# Patient Record
Sex: Male | Born: 1990 | Race: White | Hispanic: Yes | Marital: Single | Smoking: Current every day smoker
Health system: Southern US, Community
[De-identification: ages and names within clinical notes are randomized; demographics above are authoritative.]

## PROBLEM LIST (undated history)

## (undated) HISTORY — PX: FINGER SURGERY: SHX640

---

## 2013-09-07 ENCOUNTER — Emergency Department: Payer: Self-pay | Admitting: Emergency Medicine

## 2014-02-03 ENCOUNTER — Emergency Department: Payer: Self-pay | Admitting: Emergency Medicine

## 2014-02-03 LAB — CBC WITH DIFFERENTIAL/PLATELET
Basophil #: 0 10*3/uL (ref 0.0–0.1)
Basophil %: 0.2 %
EOS PCT: 0.4 %
Eosinophil #: 0 10*3/uL (ref 0.0–0.7)
HCT: 47.4 % (ref 40.0–52.0)
HGB: 15.7 g/dL (ref 13.0–18.0)
LYMPHS PCT: 8.9 %
Lymphocyte #: 0.9 10*3/uL — ABNORMAL LOW (ref 1.0–3.6)
MCH: 28.5 pg (ref 26.0–34.0)
MCHC: 33.2 g/dL (ref 32.0–36.0)
MCV: 86 fL (ref 80–100)
MONOS PCT: 8.8 %
Monocyte #: 0.9 x10 3/mm (ref 0.2–1.0)
NEUTROS ABS: 8.5 10*3/uL — AB (ref 1.4–6.5)
NEUTROS PCT: 81.7 %
PLATELETS: 194 10*3/uL (ref 150–440)
RBC: 5.52 10*6/uL (ref 4.40–5.90)
RDW: 14.3 % (ref 11.5–14.5)
WBC: 10.4 10*3/uL (ref 3.8–10.6)

## 2014-02-03 LAB — COMPREHENSIVE METABOLIC PANEL
ALT: 39 U/L (ref 12–78)
ANION GAP: 4 — AB (ref 7–16)
AST: 36 U/L (ref 15–37)
Albumin: 3.8 g/dL (ref 3.4–5.0)
Alkaline Phosphatase: 127 U/L — ABNORMAL HIGH
BILIRUBIN TOTAL: 0.6 mg/dL (ref 0.2–1.0)
BUN: 11 mg/dL (ref 7–18)
CALCIUM: 8.7 mg/dL (ref 8.5–10.1)
CREATININE: 0.83 mg/dL (ref 0.60–1.30)
Chloride: 105 mmol/L (ref 98–107)
Co2: 27 mmol/L (ref 21–32)
EGFR (African American): >60
EGFR (Non-African Amer.): >60
GLUCOSE: 97 mg/dL (ref 65–99)
Osmolality: 271 (ref 275–301)
POTASSIUM: 4 mmol/L (ref 3.5–5.1)
Sodium: 136 mmol/L (ref 136–145)
Total Protein: 7.5 g/dL (ref 6.4–8.2)

## 2014-04-26 ENCOUNTER — Emergency Department: Payer: Self-pay | Admitting: Emergency Medicine

## 2015-01-04 ENCOUNTER — Emergency Department: Payer: Self-pay | Admitting: Internal Medicine

## 2015-04-09 ENCOUNTER — Emergency Department
Admission: EM | Admit: 2015-04-09 | Discharge: 2015-04-09 | Disposition: A | Discharge status: Home / Self Care | Payer: Medicaid Other | Attending: Emergency Medicine | Admitting: Emergency Medicine

## 2015-04-09 ENCOUNTER — Encounter: Payer: Self-pay | Admitting: Emergency Medicine

## 2015-04-09 ENCOUNTER — Emergency Department: Payer: Medicaid Other

## 2015-04-09 DIAGNOSIS — S60222A Contusion of left hand, initial encounter: Secondary | ICD-10-CM

## 2015-04-09 DIAGNOSIS — Y9289 Other specified places as the place of occurrence of the external cause: Secondary | ICD-10-CM | POA: Diagnosis not present

## 2015-04-09 DIAGNOSIS — Y998 Other external cause status: Secondary | ICD-10-CM | POA: Insufficient documentation

## 2015-04-09 DIAGNOSIS — Z720 Tobacco use: Secondary | ICD-10-CM | POA: Insufficient documentation

## 2015-04-09 DIAGNOSIS — Y9389 Activity, other specified: Secondary | ICD-10-CM | POA: Diagnosis not present

## 2015-04-09 DIAGNOSIS — W1839XA Other fall on same level, initial encounter: Secondary | ICD-10-CM | POA: Diagnosis not present

## 2015-04-09 DIAGNOSIS — S6992XA Unspecified injury of left wrist, hand and finger(s), initial encounter: Secondary | ICD-10-CM | POA: Diagnosis present

## 2015-04-09 MED ORDER — IBUPROFEN 800 MG PO TABS: 800.0000 mg | ORAL_TABLET | Freq: Once | ORAL | Status: DC

## 2015-04-09 MED ORDER — TRAMADOL HCL 50 MG PO TABS: 50.0000 mg | ORAL_TABLET | Freq: Four times a day (QID) | ORAL | Status: AC | PRN

## 2015-04-09 MED ORDER — IBUPROFEN 800 MG PO TABS: 800.0000 mg | ORAL_TABLET | Freq: Three times a day (TID) | ORAL | Status: DC | PRN

## 2015-04-09 MED ORDER — IBUPROFEN 800 MG PO TABS
ORAL_TABLET | ORAL | Status: AC
  Filled 2015-04-09: qty 1

## 2015-04-09 NOTE — ED Notes (Signed)
Pt taken to Xray.

## 2015-04-09 NOTE — ED Notes (Signed)
Hurts with movement

## 2015-04-09 NOTE — ED Provider Notes (Signed)
[facility] Emergency Department Provider Note  ____________________________________________  Time seen: Approximately 1800  I have reviewed the triage vital signs and the nursing notes.   HISTORY  Chief Complaint Finger Injury    HPI Jackson Li is a 24 y.o. male L done yesterday landing on his left hand is concerned may have broken something due to the amount of swelling rates his pain as about 8 out of 10 and worse with movement relieved with rest denies any numbness tingling or weakness in the extremity has no other associated signs or symptoms as of note   History reviewed. No pertinent past medical history.  There are no active problems to display for this patient.   No past surgical history on file.  Current Outpatient Rx  Name  Route  Sig  Dispense  Refill  . ibuprofen (ADVIL,MOTRIN) 800 MG tablet   Oral   Take 1 tablet (800 mg total) by mouth every 8 (eight) hours as needed.   30 tablet   0   . traMADol (ULTRAM) 50 MG tablet   Oral   Take 1 tablet (50 mg total) by mouth every 6 (six) hours as needed for moderate pain or severe pain.   8 tablet   0     Allergies Review of patient's allergies indicates no known allergies.  No family history on file.  Social History History  Substance Use Topics  . Smoking status: Current Every Day Smoker -- 0.30 packs/day  . Smokeless tobacco: Not on file  . Alcohol Use: No    Review of Systems Constitutional: No fever/chills Eyes: No visual changes. ENT: No sore throat. Cardiovascular: Denies chest pain. Respiratory: Denies shortness of breath. Gastrointestinal: No abdominal pain.  No nausea, no vomiting.  No diarrhea.  No constipation. Genitourinary: Negative for dysuria. Musculoskeletal: Negative for back pain. Skin: Negative for rash. Neurological: Negative for headaches, focal weakness or numbness.  6-point ROS otherwise  negative.  ____________________________________________   PHYSICAL EXAM:  VITAL SIGNS: ED Triage Vitals  Enc Vitals Group     BP 04/09/15 1714 139/82 mmHg     Pulse Rate 04/09/15 1714 91     Resp 04/09/15 1714 18     Temp 04/09/15 1714 97.4 F (36.3 C)     Temp Source 04/09/15 1714 Oral     SpO2 04/09/15 1714 100 %     Weight 04/09/15 1714 220 lb (99.791 kg)     Height 04/09/15 1714 5\' 8"  (1.727 m)     Head Cir --      Peak Flow --      Pain Score 04/09/15 1715 7     Pain Loc --      Pain Edu? --      Excl. in GC? --     Constitutional: Alert and oriented. Well appearing and in no acute distress. Eyes: Conjunctivae are normal. PERRL. EOMI. Head: Atraumatic. Nose: No congestion/rhinnorhea. Mouth/Throat: Mucous membranes are moist.  Oropharynx non-erythematous. Cardiovascular: Normal rate, regular rhythm. Grossly normal heart sounds.  Good peripheral circulation. Respiratory: Normal respiratory effort.  No retractions. Lungs CTAB. Musculoskeletal: No lower extremity tenderness nor edema.  Swelling along the fourth and fifth metacarpals of the left hand there is no functional deficit 55 strength equal symmetrical bilaterally no palpable deformity or visible abnormality Neurologic:  Normal speech and language. No gross focal neurologic deficits are appreciated. Speech is normal. No gait instability. Skin:  Skin is warm, dry and intact. No rash noted. Bruising noted  to the left hand Psychiatric: Mood and affect are normal. Speech and behavior are normal.  ____________________________________________    RADIOLOGY  X-rays of patient's left hand was negative ____________________________________________   PROCEDURES  Procedure(s) performed: None  Critical Care performed: No  ____________________________________________   INITIAL IMPRESSION / ASSESSMENT AND PLAN / ED COURSE  Pertinent labs & imaging results that were available during my care of the patient were  reviewed by me and considered in my medical decision making (see chart for details).  Initial impression left hand contusion patient was placed in Ace wrap will be recommended ice and elevation through the night take Motrin as needed for pain follow-up with orthopedics for any further concerns return here for any acute concerns or worsening symptoms ____________________________________________   FINAL CLINICAL IMPRESSION(S) / ED DIAGNOSES  Final diagnoses:  Hand contusion, left, initial encounter      Micael Barb Rosalyn GessWilliam C Yecheskel Kurek, PA-C 04/09/15 1842  Sharyn CreamerMark Quale, MD 04/14/15 1659

## 2015-04-09 NOTE — Discharge Instructions (Signed)
Traumatismo contuso (Blunt Trauma) Usted ha sido evaluado por sus lesiones. Fue examinado y Mining engineerel profesional que lo asiste no Clinical cytogeneticistencontr lesiones lo suficientemente graves como para que requiera hospitalizacin. Luego de un accidente, es comn presentar mltiples magullones y dolores musculares. Estos tienden a Product manageraumentar durante las primeras 24 horas, con ms rigidez y TEFL teacherdolor en las horas siguientes, que empeorarn cuando se levante de la cama la primera maana luego del accidente. A partir de all, debera comenzar a Risk managermejorar cada da que pase. El nivel de mejora generalmente depende de la importancia de las lesiones sufridas en el accidente. Luego del accidente, si alguna parte de su cuerpo no responde como debera, o si el dolor en alguna zona se incrementa, debe regresar al ColgateServicio de Emergencias para que lo examinen nuevamente.  INSTRUCCIONES PARA EL CUIDADO DOMICILIARIO Los cuidados de rutina para las zonas doloridas deben incluir:  Harrah's EntertainmentHielo en las zonas doloridas cada 2 horas durante 20 minutos mientras est despierto durante los prximos 2 das.  Beber lquidos en abundancia (excluya el alcohol).  Darse una ducha caliente o tibia o tomar un bao una o dos veces por da para aumentar el flujo sanguneo en los msculos doloridos. Esto lo ayudar a IT sales professionalrecuperar la agilidad.  Actividades segn las tolere. Levantar pesos Social research officer, governmentpuede agravar el dolor de cuello o espalda.  Utilice los medicamentos de venta libre o de prescripcin para Chief Technology Officerel dolor, Environmental health practitionerel malestar o la Pilot Moundfiebre, segn se lo indique el profesional que lo asiste. No tome aspirina. Podran aumentar los hematomas o las hemorragias en caso de que existan pequeas zonas en las que esto ocurre. SOLICITE ATENCIN MDICA DE INMEDIATO SI OBSERVA:  Entumecimiento, hormigueo, debilidad o problemas con el uso de los brazos o las piernas.  Dolor de cabeza intenso que no mejora con medicamentos.  Cambios en el control del intestino o la vejiga.  Aumento del dolor  en otras partes del cuerpo.  Dificultades respiratorias, mareos.  Nuseas, vmitos o sudoracin.  Aumento del malestar abdominal (en el vientre).  Sangre en la orina, en las heces o vmitos con Hobartsangre.  Dolor en los hombros en el rea donde se ubicaran los tirantes de Iron Beltuna prenda.  Sensacin de Golden West Financialmareos o si ha sufrido un episodio de Ingeniodesmayo. En algunos casos no es posible identificar todas las lesiones inmediatamente despus del traumatismo. Es importante que siga controlando su enfermedad despus de la visita al servicio de Sports administratoremergencias. Si siente que no mejora, o mejora ms lentamente que lo que debera esperarse, llame a su mdico. Si siente que sus sntomas (problemas) empeoran, vuelva al Servicio de Emergencias inmediatamente. Document Released: 11/17/2005 Document Revised: 02/09/2012 [facility] Patient Information 2015 [place]Care, [place]. This information is not intended to replace advice given to you by your health care provider. Make sure you discuss any questions you have with your health care provider.  Contusin  (Contusion)  Una contusin es un hematoma interno. Las contusiones ocurren cuando un traumatismo causa un sangrado debajo de la piel. Los signos de hematoma son dolor, inflamacin (hinchazn) y cambio de color en la piel. La contusin Clear Channel Communicationspuede volverse azul, prpura o Capronamarilla. CUIDADOS EN EL HOGAR   Aplique hielo sobre la zona lesionada.  Ponga el hielo en una bolsa plstica.  Colquese una toalla entre la piel y la bolsa de hielo.  Deje el hielo durante 15 a 20 minutos, 3 a 4 veces por da.  Slo tome los medicamentos segn le indique el mdico.  Haga que la zona lesionada repose.  En lo  posible, levante (eleve) la zona lesionada para disminuir la hinchazn. SOLICITE AYUDA DE INMEDIATO SI:   El hematoma o la hinchazn aumentan.  Siente que Community education officer.  La hinchazn o el dolor no se [employer] Energy. ASEGRESE DE QUE:   Comprende estas  instrucciones.  Controlar su enfermedad.  Solicitar ayuda de inmediato si no mejora o si empeora. Document Released: 11/06/2011 Document Revised: 02/09/2012 [facility] Patient Information 2015 [place], [place]. This information is not intended to replace advice given to you by your health care provider. Make sure you discuss any questions you have with your health care provider.  Crioterapia  (Cryotherapy)  El trmino crioterapia significa tratamiento mediante el fro. Bolsas con hielo o gel se utilizan para reducir Chief Technology Officer y la inflamacin. El hielo es ms efectivo dentro de las primeras 24 a 48 horas despus de una lesin o trastornos por uso excesivo de un msculo o Risk analyst. El hielo puede calmar esguinces, distensiones, espasmos, ardor, dolor punzante y [employer] Energy. Tambin puede usarse para la recuperacin luego de [place]. El hielo es Garrochales, tiene muy pocos efectos adversos y es seguro para que lo utilicen la mayora de Raytheon.  PRECAUCIONES  El hielo no es una opcin segura de tratamiento para las personas con:   Fenmeno de Raynaud. Este es un trastorno que afecta los vasos sanguneos pequeos en las extremidades. La exposicin al fro DTE Energy Company problemas vuelvan.  Hipersensibilidad al fro. Hay diferentes tipos de hipersensibilidad al fro, [employer] se incluyen:  Urticaria por el fro. Ronchas rojas y que pican que aparecen en la piel cuando los tejidos comienzan a calentarse despus de recibir el fro.  Eritema por fro. Se trata de una erupcin de color rojo y que pica, causada por la exposicin al fro.  Hemoglobinuria por fro. Los glbulos rojos se destruyen cuando los tejidos comienzan a calentarse despus de enfriarse. La hemoglobina que transporta oxgeno pasa a la orina debido a que no se puede combinar con protenas de la sangre lo suficientemente rpido.  Entumecimiento o alteracin de la sensibilidad en el rea que se enfra. Si  usted tiene Health Net, no utilice hielo hasta que haya hablado con su mdico:   Enfermedades cardacas, como arritmias, angina o enfermedad cardaca crnica.  Hipertensin arterial.  Heridas que se estn curando o abiertas en la zona en la que va a aplicar el hielo.  Infecciones actuales.  Artritis reumatoidea.  Mala circulacin.  Diabetes. El hielo disminuye el flujo de sangre en la regin en la que se aplica. Esto es beneficioso cuando se trata de evitar que se propaguen ciertas sustancias qumicas irritantes desde los tejidos inflamados a los tejidos circundantes. Sin embargo, si se expone la piel a las temperaturas fras durante demasiado tiempo o sin la proteccin [place], puede daarse la piel o los nervios. Observe si hay seales de dao en la piel debido al fro.  INSTRUCCIONES PARA EL CUIDADO EN EL HOGAR  Siga estos consejos para usar hielo y compresas fras con seguridad.   Coloque una toalla seca o hmeda entre el hielo y la piel. Una toalla hmeda se enfriar ms rpidamente la piel, lo que puede hacer necesario acortar el tiempo que se utiliza el hielo.  Para obtener una respuesta ms rpida, puede comprimir suavemente el hielo.  Aplique el hielo durante no ms de 10 a 20 minutos a la vez. Cuanto ms hueso haya en la zona en la que aplique  el hielo, menos tiempo se necesitar para obtener los beneficios.  Revise su piel despus de 5 minutos para asegurarse de que no hay seales de BJ's Wholesaleuna mala respuesta al fro o un dao en la piel.  Descanse 20 minutos o ms Union Pacific Corporationentre las aplicaciones.  Una vez que la piel est adormecida, puede finalizar el Strathmoretratamiento. Puede probar si hay adormecimiento tocando ligeramente la piel. El toque debe ser tan ligero que no deje un hoyuelo en la piel por la presin hecha con la punta del dedo. Al aplicar hielo, la Harley-Davidsonmayora de las personas sentirn sensaciones normales en este orden: fro, ardor, dolor y entumecimiento.  No  use hielo sobre alguien que no puede comunicar sus respuestas al dolor, como los nios pequeos o personas con demencia. CMO HACER UNA COMPRESA DE HIELO  Las compresas de hielo son el modo ms frecuente de Chemical engineerutilizar la terapia con hielo. Otros mtodos son los masajes con hielo, baos de hielo, y aerosol fro. Las cremas musculares que producen fro, sensacin de hormigueo no ofrecen los mismos beneficios que ofrece el hielo y no debe ser utilizado como un sustituto excepto que lo recomiende su mdico.  Para hacer una compresa de hielo, haga lo siguiente:   Ponga hielo picado o una bolsa de verduras congeladas en una bolsa de plstico con cierre. Extraiga el exceso de [place] Oswegoaire. Coloque esta bolsa dentro de Liechtensteinotra bolsa de plstico. Deslice la bolsa en una funda de almohada o coloque una toalla hmeda entre su piel y la Dexterbolsa.  Mezcle 3 partes de agua con 1 parte de alcohol fino. Congelar la mezcla en una bolsa plstica con cierre. Cuando se retira Set designerla mezcla del Electrical engineercongelador, tendr un aspecto fangoso. Extraiga el exceso de [place]am[place]aire. Coloque esta bolsa dentro de Liechtensteinotra bolsa de plstico. Deslice la bolsa en una funda de almohada o coloque una toalla hmeda entre su piel y la Edgem[place] Parkbolsa. SOLICITE ATENCIN MDICA SI:   Tiene manchas blancas en la piel. Esto puede dar a la piel una apariencia (moteada).  Su piel se vuelve azul o plida.  Tiene un aspecto ceroso o est dura.  La hinchazn empeora. ASEGRESE DE QUE:   Comprende estas instrucciones.  Controlar su enfermedad.  Solicitar ayuda de inmediato si no mejora o si empeora. Document Released: 11/06/2011 Document Revised: 02/09/2012 [facility] Patient Information 2015 [place]ExitCare, [place]LLC. This information is not intended to replace advice given to you by your health care provider. Make sure you discuss any questions you have with your health care provider.

## 2015-10-01 ENCOUNTER — Emergency Department
Admission: EM | Admit: 2015-10-01 | Discharge: 2015-10-01 | Disposition: A | Discharge status: Home / Self Care | Payer: Self-pay | Attending: Emergency Medicine | Admitting: Emergency Medicine

## 2015-10-01 ENCOUNTER — Encounter: Payer: Self-pay | Admitting: Emergency Medicine

## 2015-10-01 DIAGNOSIS — J069 Acute upper respiratory infection, unspecified: Secondary | ICD-10-CM | POA: Insufficient documentation

## 2015-10-01 DIAGNOSIS — Z720 Tobacco use: Secondary | ICD-10-CM | POA: Insufficient documentation

## 2015-10-01 MED ORDER — PREDNISONE 10 MG PO TABS: 50.0000 mg | ORAL_TABLET | Freq: Every day | ORAL | Status: DC

## 2015-10-01 MED ORDER — ALBUTEROL SULFATE HFA 108 (90 BASE) MCG/ACT IN AERS: 2.0000 | INHALATION_SPRAY | Freq: Four times a day (QID) | RESPIRATORY_TRACT | Status: DC | PRN

## 2015-10-01 MED ORDER — AZITHROMYCIN 250 MG PO TABS: ORAL_TABLET | ORAL | Status: DC

## 2015-10-01 MED ORDER — GUAIFENESIN-CODEINE 100-10 MG/5ML PO SYRP: 5.0000 mL | ORAL_SOLUTION | Freq: Three times a day (TID) | ORAL | Status: DC | PRN

## 2015-10-01 MED ORDER — ALBUTEROL SULFATE (2.5 MG/3ML) 0.083% IN NEBU
2.5000 mg | INHALATION_SOLUTION | Freq: Once | RESPIRATORY_TRACT | Status: AC
  Administered 2015-10-01: 2.5 mg via RESPIRATORY_TRACT
  Filled 2015-10-01: qty 3

## 2015-10-01 NOTE — Discharge Instructions (Signed)
Vaporizadores de aire fro Clinical research associate) Los vaporizadores ayudan a Paramedic los sntomas de la tos y Metallurgist. Agregan humedad aSoil scientist aire, lo que fluidifica el moco y lo hace menos espeso. Facilitan la respiracin y favorecen la eliminacin de secreciones. Los vaporizadores de aire fro no provocan quemaduras serias Lubrizol Corporation de aire caliente, que tambin se llaman humidificadores. No se ha probado que los vaporizadores mejoren el resfro. No debe usar un vaporizador si es Pharmacologist. INSTRUCCIONES PARA EL CUIDADO EN EL HOGAR  Siga las instrucciones para el uso del vaporizador que se encuentran en la caja.  Use solamente agua destilada en el vaporizador.  No use el vaporizador en forma continua. Esto puede formar moho o hacer que se desarrollen bacterias en el vaporizador.  Limpie el vaporizador cada vez que se use.  Lmpielo y squelo bien antes de guardarlo.  Deje de usarlo si los sntomas respiratorios empeoran.   Esta informacin no tiene Theme park manager el consejo del mdico. Asegrese de hacerle al mdico cualquier pregunta que tenga.   Document Released: 07/20/2013 Document Revised: 11/22/2013 Elsevier Interactive Patient Education 2016 [employer]itor.  Infeccin del tracto respiratorio superior, adultos (Upper Respiratory Infection, Adult) La mayora de las infecciones del tracto respiratorio superior son infecciones virales de las vas que llevan el aire a los pulmones. Un infeccin del tracto respiratorio superior afecta la nariz, la garganta y las vas respiratorias superiores. El tipo ms frecuente de infeccin del tracto respiratorio superior es la nasofaringitis, que habitualmente se conoce como "resfro comn". Las infecciones del tracto respiratorio superior siguen su curso y por lo general se curan solas. En la [employer], la infeccin del tracto respiratorio superior no requiere atencin [place], Biomedical engineer a veces, despus de una infeccin viral, puede  surgir una infeccin bacteriana en las vas respiratorias superiores. Esto se conoce como infeccin secundaria. Las infecciones sinusales y en el odo medio son tipos frecuentes de infecciones secundarias en el tracto respiratorio superior. La neumona bacteriana tambin puede complicar un cuadro de infeccin del tracto respiratorio superior. Este tipo de infeccin puede empeorar el asma y la enfermedad pulmonar obstructiva crnica (EPOC). En algunos casos, estas complicaciones pueden requerir atencin mdica de emergencia y poner en peligro la vida.  CAUSAS Casi todas las infecciones del tracto respiratorio superior se deben a los virus. Un virus es un tipo de microbio que puede contagiarse de Neomia Dear persona a Educational psychologist.  FACTORES DE RIESGO Puede estar en riesgo de sufrir una infeccin del tracto respiratorio superior si:   Fuma.  Tiene una enfermedad pulmonar o cardaca crnica.  Tiene debilitado el sistema de defensa (inmunitario) del cuerpo.  Es [address] o de edad muy [place].  Tiene asma o alergias nasales.  Trabaja en reas donde hay mucha gente o poca ventilacin.  Rudi Coco en una escuela o en un centro de atencin mdica. SIGNOS Y SNTOMAS  Habitualmente, los sntomas aparecen de 2a 3das despus de entrar en contacto con el virus del resfro. La mayora de las infecciones virales en el tracto respiratorio superior duran de 7a 10das. Sin embargo, las infecciones virales en el tracto respiratorio superior a causa del virus de la gripe pueden durar de 14a 18das y, habitualmente, son ms graves. Entre los sntomas se pueden incluir los siguientes:   Secrecin o congestin nasal.  Estornudos.  Tos.  Dolor de Advertising copywriter.  Dolor de [place].  Fatiga.  Grant Ruts.  Prdida del apetito.  Dolor en la frente, detrs de los ojos y  por encima de los pmulos (dolor sinusal).  Dolores musculares. DIAGNSTICO  El mdico puede diagnosticar una infeccin del tracto respiratorio superior  mediante los siguientes estudios:  Examen fsico.  Pruebas para verificar si los sntomas no se deben a otra afeccin, por ejemplo:  Faringitis estreptoccica.  Sinusitis.  Neumona.  Asma. TRATAMIENTO  Esta infeccin desaparece sola, con el tiempo. No puede curarse con medicamentos, pero a menudo se prescriben para aliviar los sntomas. Los medicamentos pueden ser tiles para lo siguiente:  Personal assistantBajar la fiebre.  Reducir la tos.  Aliviar la congestin nasal. INSTRUCCIONES PARA EL CUIDADO EN EL HOGAR   Tome los medicamentos solamente como se lo haya indicado el mdico.  A fin de Engineer, materialsaliviar el dolor de garganta, haga grgaras con solucin salina templada o consuma caramelos para la tos, como se lo haya indicado el mdico.  Use un humidificador de vapor clido o inhale el vapor de la ducha para aumentar la humedad del aire. Esto facilitar la respiracin.  Beba suficiente lquido para Photographermantener la orina clara o de color amarillo plido.  Consuma sopas y otros caldos transparentes, y Abbott Laboratoriesalimntese bien.  Descanse todo lo que sea necesario.  Regrese al Aleen Campitrabajo cuando la temperatura se le haya normalizado o cuando el mdico lo autorice. Es posible que deba quedarse en su casa durante un tiempo prolongado, para no infectar a los dems. Tambin puede usar un barbijo y lavarse las manos con cuidado para Transport plannerevitar la propagacin del virus.  Aumente el uso del inhalador si tiene asma.  No consuma ningn producto que contenga tabaco, lo que incluye cigarrillos, tabaco de Theatre managermascar o Administrator, Civil Servicecigarrillos electrnicos. Si necesita ayuda para dejar de fumar, consulte al American Expressmdico. PREVENCIN  La mejor manera de protegerse de un resfro es mantener una higiene Conneradecuada.   Evite el contacto oral o fsico con personas que tengan sntomas de resfro.  En caso de contacto, lvese las manos con frecuencia. No hay pruebas claras de que la vitaminaC, la vitaminaE, la equincea o el ejercicio reduzcan la probabilidad de  Primary school teachercontraer un resfro. Sin embargo, siempre se recomienda Insurance account managerdescansar mucho, hacer ejercicio y Engineering geologistalimentarse bien.  SOLICITE ATENCIN MDICA SI:   Su estado empeora en lugar de mejorar.  Los medicamentos no Estate agentlogran controlar los sntomas.  Tiene escalofros.  La sensacin de falta de aire empeora.  Tiene mucosidad marrn o roja.  Tiene secrecin nasal amarilla o marrn.  Le duele la cara, especialmente al inclinarse hacia adelante.  Tiene fiebre.  Tiene los ganglios del cuello hinchados.  Siente dolor al tragar.  Tiene zonas blancas en la parte de atrs de la garganta. SOLICITE ATENCIN MDICA DE INMEDIATO SI:   Tiene sntomas intensos o persistentes de:  Dolor de Turkmenistancabeza.  Dolor de odos.  Dolor sinusal.  Dolor en el pecho.  Tiene enfermedad pulmonar crnica y cualquiera de estos sntomas:  Sibilancias.  Tos prolongada.  Tos con sangre.  Cambio en la mucosidad habitual.  Presenta rigidez en el cuello.  Tiene cambios en:  La visin.  La audicin.  El pensamiento.  El Edgemontestado de nimo. ASEGRESE DE QUE:   Comprende estas instrucciones.  Controlar su afeccin.  Recibir ayuda de inmediato si no mejora o si empeora.   Esta informacin no tiene Theme park managercomo fin reemplazar el consejo del mdico. Asegrese de hacerle al mdico cualquier pregunta que tenga.   Document Released: 08/27/2005 Document Revised: 04/03/2015 Elsevier Interactive Patient Education [employer]2016 Elsevier Inc.

## 2015-10-01 NOTE — ED Provider Notes (Signed)
[facility] Emergency Department Provider Note ____________________________________________  Time seen: Approximately 3:16 PM  I have reviewed the triage vital signs and the nursing notes.   HISTORY  Chief Complaint Cough   HPI Jackson Li is a 24 y.o. male who presents to the emergency department for a four-day history of cough, congestion, wheezing, and pharyngitis. He reports that the cough and wheezing are worse at night. He denies fever. He has been taking over-the-counter cold medicines without relief. He does smoke cigarettes.   History reviewed. No pertinent past medical history.  There are no active problems to display for this patient.   History reviewed. No pertinent past surgical history.  Current Outpatient Rx  Name  Route  Sig  Dispense  Refill  . ibuprofen (ADVIL,MOTRIN) 800 MG tablet   Oral   Take 1 tablet (800 mg total) by mouth every 8 (eight) hours as needed.   30 tablet   0   . traMADol (ULTRAM) 50 MG tablet   Oral   Take 1 tablet (50 mg total) by mouth every 6 (six) hours as needed for moderate pain or severe pain.   8 tablet   0     Allergies Review of patient's allergies indicates no known allergies.  No family history on file.  Social History Social History  Substance Use Topics  . Smoking status: Current Every Day Smoker -- 0.30 packs/day    Types: Cigarettes  . Smokeless tobacco: None  . Alcohol Use: No    Review of Systems Constitutional: No fever/chills Eyes: No visual changes. ENT: No sore throat. Cardiovascular: Denies chest pain. Respiratory: Negative for shortness of breath. Positive for cough. Gastrointestinal: Negative for abdominal pain. Negative for nausea,  no vomiting.  No diarrhea.  Genitourinary: Negative for dysuria. Musculoskeletal: Negative for body aches Skin: Negative for rash. Neurological: Negative for headaches, Negative for focal weakness or numbness.  10-point ROS  otherwise negative.  ____________________________________________   PHYSICAL EXAM:  VITAL SIGNS: ED Triage Vitals  Enc Vitals Group     BP 10/01/15 1508 125/88 mmHg     Pulse Rate 10/01/15 1508 102     Resp 10/01/15 1508 18     Temp 10/01/15 1508 98.7 F (37.1 C)     Temp Source 10/01/15 1508 Oral     SpO2 10/01/15 1508 95 %     Weight 10/01/15 1508 223 lb (101.152 kg)     Height 10/01/15 1508  (1.727 m)     Head Cir --      Peak Flow --      Pain Score 10/01/15 1510 3     Pain Loc --      Pain Edu? --      Excl. in GC? --     Constitutional: Alert and oriented. Well appearing and in no acute distress. Eyes: Conjunctivae are normal. PERRL. EOMI. Head: Atraumatic. Nose: No congestion/rhinnorhea. Mouth/Throat: Mucous membranes are moist.  Oropharynx non-erythematous. Neck: No stridor.  Lymphatic: No cervical lymphadenopathy. Cardiovascular: Normal rate, regular rhythm. Grossly normal heart sounds.  Good peripheral circulation. Respiratory: Normal respiratory effort.  No retractions. Lungs expiratory wheezes noted throughout. Gastrointestinal: Soft and nontender. No distention. No abdominal bruits. No CVA tenderness. Musculoskeletal: No joint pain reported. Neurologic:  Normal speech and language. No gross focal neurologic deficits are appreciated. Speech is normal. No gait instability. Skin:  Skin is warm, dry and intact. No rash noted. Psychiatric: Mood and affect are normal. Speech and behavior are normal.  ____________________________________________   LABS (all labs ordered are listed, but only abnormal results are displayed)  Labs Reviewed - No data to display ____________________________________________  EKG   ____________________________________________  RADIOLOGY   ____________________________________________   PROCEDURES  Procedure(s) performed: None  Critical Care performed: No  ____________________________________________   INITIAL  IMPRESSION / ASSESSMENT AND PLAN / ED COURSE  Pertinent labs & imaging results that were available during my care of the patient were reviewed by me and considered in my medical decision making (see chart for details).   Azithromycin, Robitussin-AC, albuterol, and prednisone as prescribed. The patient was encouraged to return to the emergency department for symptoms that change or worsen if he is unable to schedule an appointment with primary care. ____________________________________________   FINAL CLINICAL IMPRESSION(S) / ED DIAGNOSES  Final diagnoses:  None       Chinita PesterCari B Jamorion Gomillion, FNP 10/01/15 1554  Phineas SemenGraydon Goodman, MD 10/01/15 1627

## 2015-10-01 NOTE — ED Notes (Signed)
C/O cough and sinus congestion  Onset of symptoms Friday. Has been taking Dayquil with no relief.  Patient has not taken a flu shot this year.  Denies fever.

## 2015-10-01 NOTE — ED Notes (Signed)
AAOx3.  Skin warm and dry.  No SOB/ DOE.  Sinus congestion noted.

## 2015-10-01 NOTE — ED Notes (Signed)
AAOx3.  Skin warm and dry. Lungs with improved air entry bilaterally.  No SOB/ DOE.  NAD.

## 2016-02-07 ENCOUNTER — Emergency Department
Admission: EM | Admit: 2016-02-07 | Discharge: 2016-02-07 | Disposition: A | Discharge status: Home / Self Care | Payer: Self-pay | Attending: Emergency Medicine | Admitting: Emergency Medicine

## 2016-02-07 ENCOUNTER — Encounter: Payer: Self-pay | Admitting: Emergency Medicine

## 2016-02-07 ENCOUNTER — Emergency Department: Payer: Self-pay

## 2016-02-07 DIAGNOSIS — S6391XA Sprain of unspecified part of right wrist and hand, initial encounter: Secondary | ICD-10-CM | POA: Insufficient documentation

## 2016-02-07 DIAGNOSIS — Z7952 Long term (current) use of systemic steroids: Secondary | ICD-10-CM | POA: Insufficient documentation

## 2016-02-07 DIAGNOSIS — Y9389 Activity, other specified: Secondary | ICD-10-CM | POA: Insufficient documentation

## 2016-02-07 DIAGNOSIS — Z792 Long term (current) use of antibiotics: Secondary | ICD-10-CM | POA: Insufficient documentation

## 2016-02-07 DIAGNOSIS — F17210 Nicotine dependence, cigarettes, uncomplicated: Secondary | ICD-10-CM | POA: Insufficient documentation

## 2016-02-07 DIAGNOSIS — Y998 Other external cause status: Secondary | ICD-10-CM | POA: Insufficient documentation

## 2016-02-07 DIAGNOSIS — Y9289 Other specified places as the place of occurrence of the external cause: Secondary | ICD-10-CM | POA: Insufficient documentation

## 2016-02-07 DIAGNOSIS — W1839XA Other fall on same level, initial encounter: Secondary | ICD-10-CM | POA: Insufficient documentation

## 2016-02-07 MED ORDER — NAPROXEN 500 MG PO TABS: 500.0000 mg | ORAL_TABLET | Freq: Two times a day (BID) | ORAL | Status: DC

## 2016-02-07 NOTE — ED Provider Notes (Signed)
[facility] Emergency Department Provider Note  ____________________________________________  Time seen: Approximately 8:33 AM  I have reviewed the triage vital signs and the nursing notes.   HISTORY  Chief Complaint Fall    HPI Jackson Li is a 25 y.o. male patient complaining right hand pain secondary to a fall 3 days ago. Patient stated fell backwards bracing his fall with his right hand. Since the fall he has continued pain and edema to the hand. Patient taken over-the-counter Motrin with no relief. Patient is right-hand dominant. Patient rates his pain as a 4/10. He describes the pain as dull. States no loss sensation but decreased range of motion with the right thumb.  History reviewed. No pertinent past medical history.  There are no active problems to display for this patient.   History reviewed. No pertinent past surgical history.  Current Outpatient Rx  Name  Route  Sig  Dispense  Refill  . albuterol (PROVENTIL HFA;VENTOLIN HFA) 108 (90 BASE) MCG/ACT inhaler   Inhalation   Inhale 2 puffs into the lungs every 6 (six) hours as needed for wheezing or shortness of breath.   1 Inhaler   2   . azithromycin (ZITHROMAX) 250 MG tablet      2 tablets today, then 1 tablet for the next 4 days.   6 each   0   . guaiFENesin-codeine (ROBITUSSIN AC) 100-10 MG/5ML syrup   Oral   Take 5 mLs by mouth 3 (three) times daily as needed for cough.   120 mL   0   . ibuprofen (ADVIL,MOTRIN) 800 MG tablet   Oral   Take 1 tablet (800 mg total) by mouth every 8 (eight) hours as needed.   30 tablet   0   . naproxen (NAPROSYN) 500 MG tablet   Oral   Take 1 tablet (500 mg total) by mouth 2 (two) times daily with a meal.   20 tablet   00   . predniSONE (DELTASONE) 10 MG tablet   Oral   Take 5 tablets (50 mg total) by mouth daily.   25 tablet   0   . traMADol (ULTRAM) 50 MG tablet   Oral   Take 1 tablet (50 mg total) by mouth every 6 (six)  hours as needed for moderate pain or severe pain.   8 tablet   0     Allergies Review of patient's allergies indicates no known allergies.  No family history on file.  Social History Social History  Substance Use Topics  . Smoking status: Current Every Day Smoker -- 0.30 packs/day    Types: Cigarettes  . Smokeless tobacco: None  . Alcohol Use: No    Review of Systems Constitutional: No fever/chills Eyes: No visual changes. ENT: No sore throat. Cardiovascular: Denies chest pain. Respiratory: Denies shortness of breath. Gastrointestinal: No abdominal pain.  No nausea, no vomiting.  No diarrhea.  No constipation. Genitourinary: Negative for dysuria. Musculoskeletal: Right hand pain and edema Skin: Negative for rash. Neurological: Negative for headaches, focal weakness or numbness. 10-point ROS otherwise negative.  ____________________________________________   PHYSICAL EXAM:  VITAL SIGNS: ED Triage Vitals  Enc Vitals Group     BP 02/07/16 0821 138/90 mmHg     Pulse Rate 02/07/16 0821 110     Resp 02/07/16 0821 20     Temp 02/07/16 0821 98.7 F (37.1 C)     Temp Source 02/07/16 0821 Oral     SpO2 02/07/16 0821 98 %  Weight 02/07/16 0821 220 lb (99.791 kg)     Height 02/07/16 0821  (1.727 m)     Head Cir --      Peak Flow --      Pain Score 02/07/16 0821 4     Pain Loc --      Pain Edu? --      Excl. in GC? --     Constitutional: Alert and oriented. Well appearing and in no acute distress. Eyes: Conjunctivae are normal. PERRL. EOMI. Head: Atraumatic. Nose: No congestion/rhinnorhea. Mouth/Throat: Mucous membranes are moist.  Oropharynx non-erythematous. Neck: No stridor.  No cervical spine tenderness to palpation. Hematological/Lymphatic/Immunilogical: No cervical lymphadenopathy. Cardiovascular: Normal rate, regular rhythm. Grossly normal heart sounds.  Good peripheral circulation. Respiratory: Normal respiratory effort.  No retractions. Lungs  CTAB. Gastrointestinal: Soft and nontender. No distention. No abdominal bruits. No CVA tenderness. Musculoskeletal: No lower extremity tenderness nor edema.  No joint effusions. Neurologic:  Normal speech and language. No gross focal neurologic deficits are appreciated. No gait instability. Skin:  Skin is warm, dry and intact. No rash noted. Psychiatric: Mood and affect are normal. Speech and behavior are normal.  ____________________________________________   LABS (all labs ordered are listed, but only abnormal results are displayed)  Labs Reviewed - No data to display ____________________________________________  EKG   ____________________________________________  RADIOLOGY  Acute findings on x-ray. ____________________________________________   PROCEDURES  Procedure(s) performed: None  Critical Care performed: No  ____________________________________________   INITIAL IMPRESSION / ASSESSMENT AND PLAN / ED COURSE  Pertinent labs & imaging results that were available during my care of the patient were reviewed by me and considered in my medical decision making (see chart for details).  Right hand sprain. This x-ray finding with patient. Patient is discharged care instructions. Patient get a prescription for naproxen. Patient advised to [facility] condition persists. ____________________________________________   FINAL CLINICAL IMPRESSION(S) / ED DIAGNOSES  Final diagnoses:  Hand sprain, right, initial encounter      Joni Reining, PA-C 02/07/16 0945  Myrna Blazer, MD 02/09/16 2101

## 2016-02-07 NOTE — ED Notes (Signed)
States he fell backwards ..states his arm went behind him  Having pain pain to right hand   Min swelling noted

## 2016-09-08 IMAGING — DX DG HAND COMPLETE 3+V*R*
3 series · 3 of 3 positions shown · non-contrast
Comparison: None.

CLINICAL DATA: Fall onto right hand with thumb pain, initial
encounter

EXAM:
RIGHT HAND - COMPLETE 3+ VIEW

[hand ap]
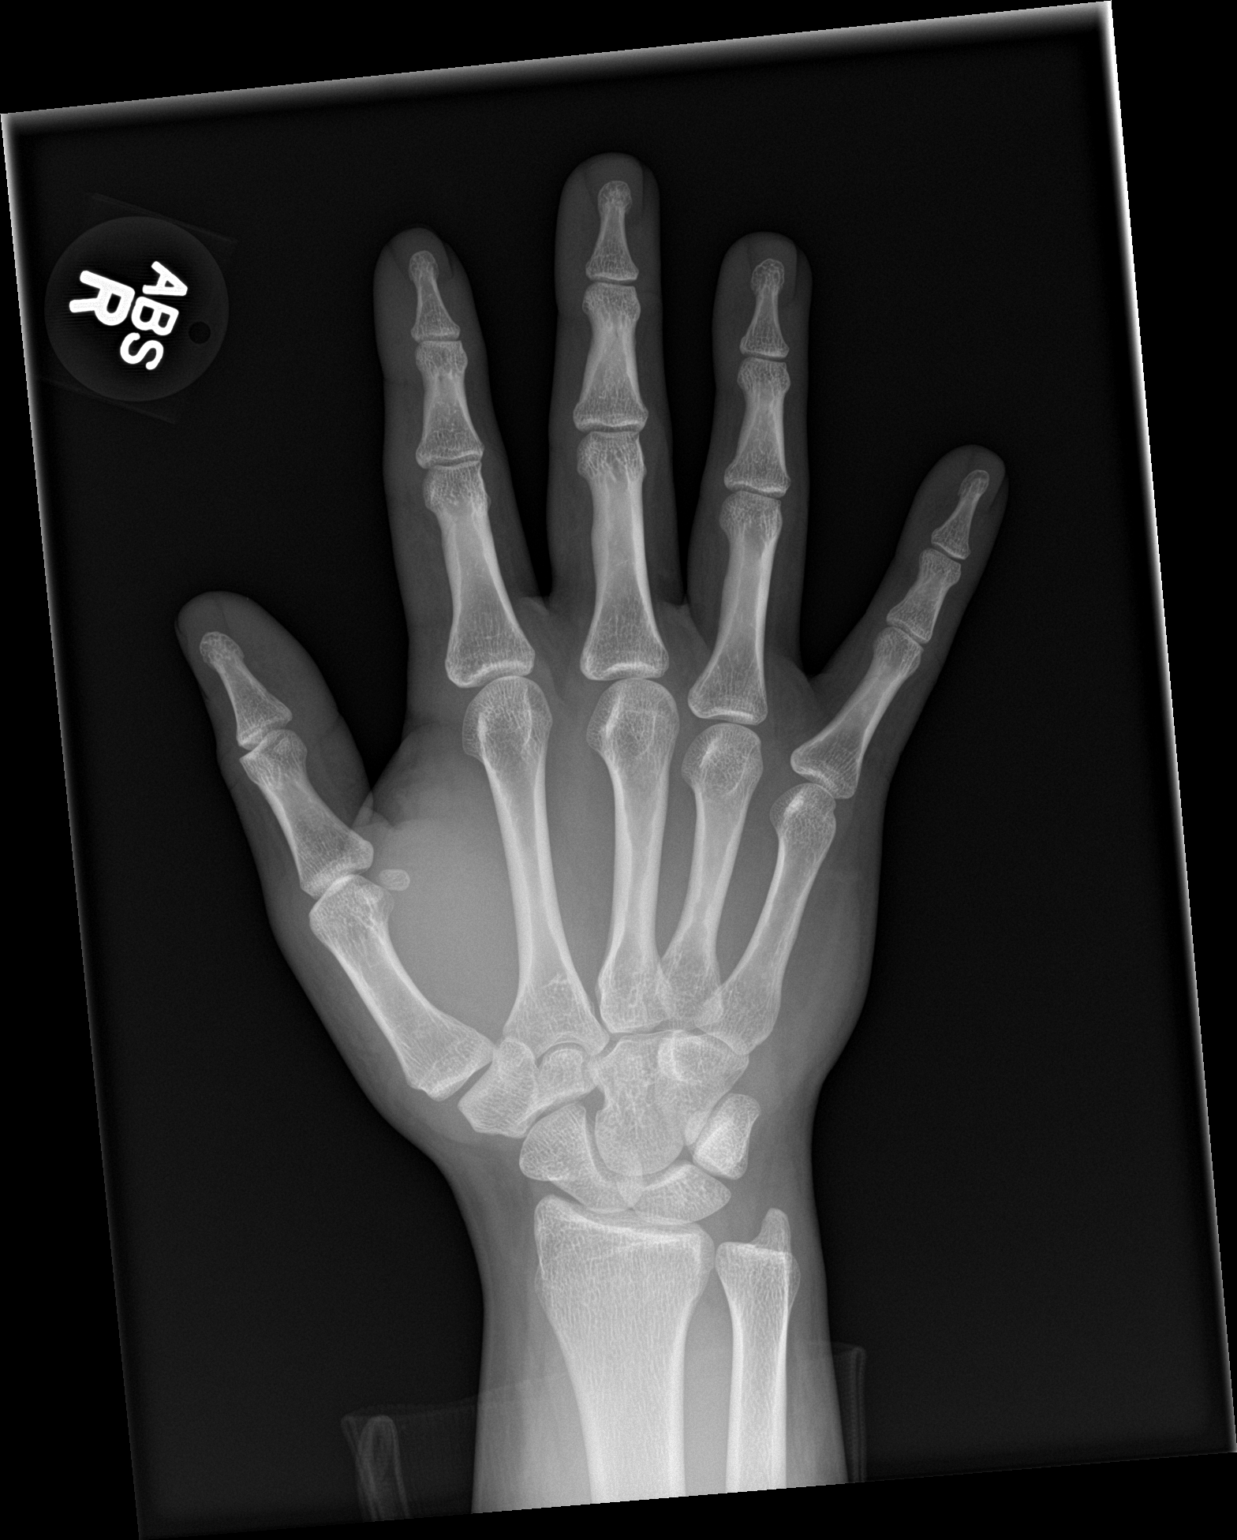

[hand obl]
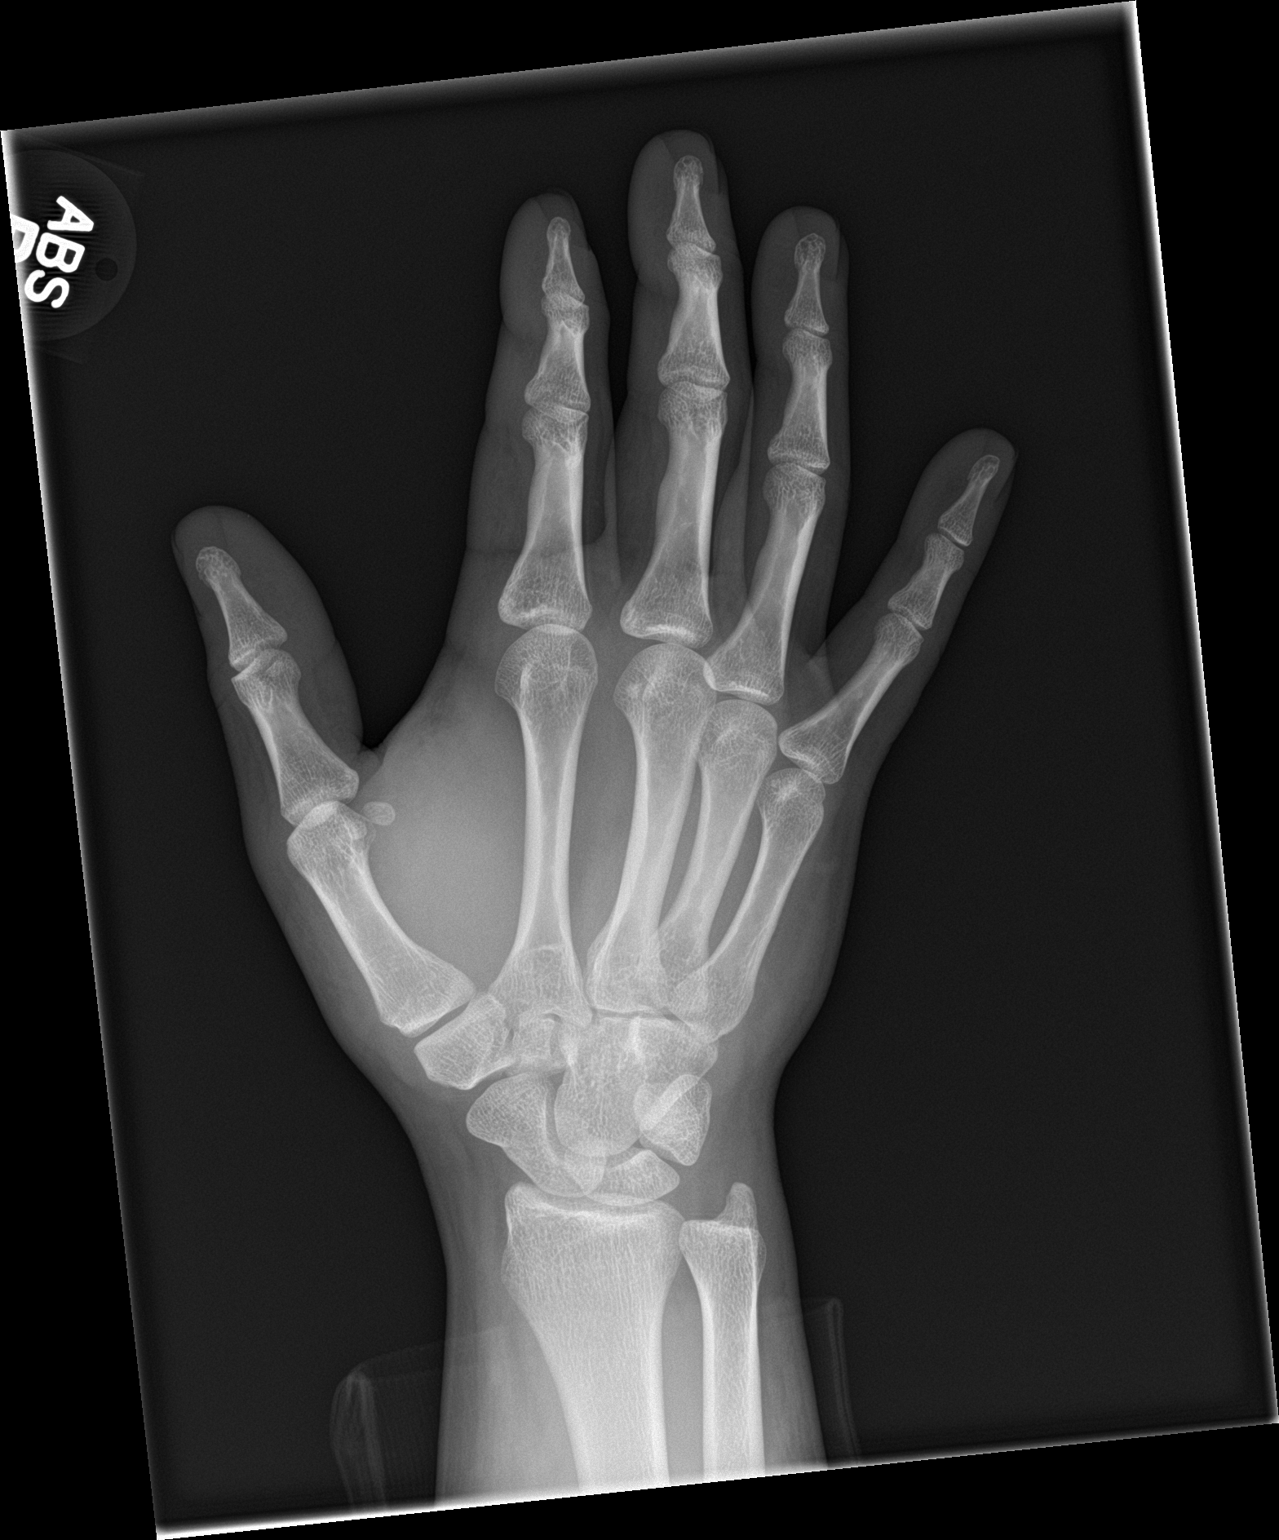

[hand lat]
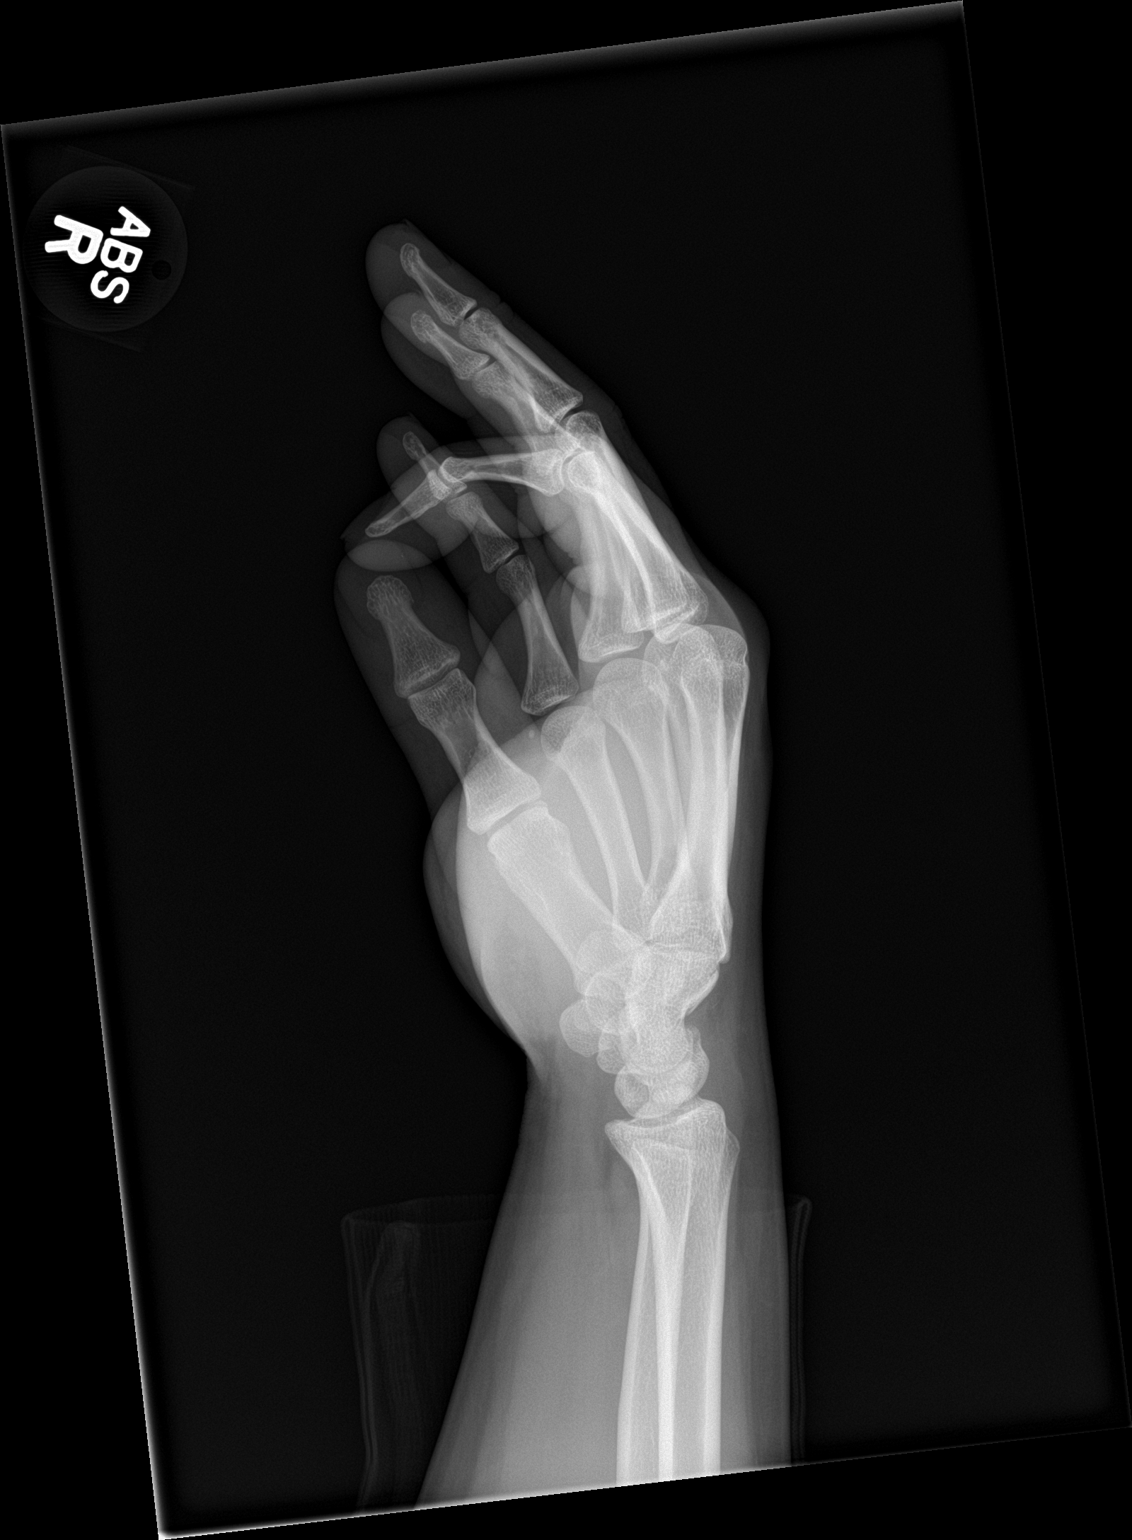

[3 of 3 positions shown; findings below may reference images not displayed]

FINDINGS: There is no evidence of fracture or dislocation. There is no
evidence of arthropathy or other focal bone abnormality. Soft
tissues are unremarkable.
IMPRESSION: No acute abnormality noted.

## 2018-12-30 ENCOUNTER — Other Ambulatory Visit: Payer: Self-pay

## 2018-12-30 ENCOUNTER — Emergency Department
Admission: EM | Admit: 2018-12-30 | Discharge: 2018-12-30 | Disposition: A | Discharge status: Home / Self Care | Payer: Self-pay | Attending: Emergency Medicine | Admitting: Emergency Medicine

## 2018-12-30 DIAGNOSIS — J40 Bronchitis, not specified as acute or chronic: Secondary | ICD-10-CM | POA: Insufficient documentation

## 2018-12-30 DIAGNOSIS — B349 Viral infection, unspecified: Secondary | ICD-10-CM | POA: Insufficient documentation

## 2018-12-30 DIAGNOSIS — F17210 Nicotine dependence, cigarettes, uncomplicated: Secondary | ICD-10-CM | POA: Insufficient documentation

## 2018-12-30 LAB — GROUP A STREP BY PCR: Group A Strep by PCR: NOT DETECTED

## 2018-12-30 MED ORDER — BENZONATATE 100 MG PO CAPS: ORAL_CAPSULE | ORAL | 0 refills | Status: AC

## 2018-12-30 MED ORDER — IPRATROPIUM-ALBUTEROL 0.5-2.5 (3) MG/3ML IN SOLN
3.0000 mL | Freq: Once | RESPIRATORY_TRACT | Status: AC
Start: 2018-12-30 — End: 2018-12-30
  Administered 2018-12-30: 3 mL via RESPIRATORY_TRACT
  Filled 2018-12-30: qty 3

## 2018-12-30 MED ORDER — AZITHROMYCIN 250 MG PO TABS: ORAL_TABLET | ORAL | 0 refills | Status: AC

## 2018-12-30 MED ORDER — ALBUTEROL SULFATE HFA 108 (90 BASE) MCG/ACT IN AERS: 2.0000 | INHALATION_SPRAY | Freq: Four times a day (QID) | RESPIRATORY_TRACT | 0 refills | Status: AC | PRN

## 2018-12-30 MED ORDER — FLUTICASONE PROPIONATE 50 MCG/ACT NA SUSP: 2.0000 | Freq: Every day | NASAL | 0 refills | Status: AC

## 2018-12-30 NOTE — ED Triage Notes (Signed)
C/o body aches, sore throat and fever-flu like sx.  Pt alert and oriented X4, active, cooperative, pt in NAD. RR even and unlabored, color WNL.  Given mask in triage.

## 2018-12-30 NOTE — Discharge Instructions (Addendum)
Your strep test was negative. Your symptoms are concerning for a viral URI or bronchitis. You will be treated with antibiotics, cough medicines, nasal spray, and an inhaler. You may also take OTC Delsym for additional cough relief. Follow-up with [facility] or [facility] if needed.   Tu prueba de estreptococo fue negativa. Los sntomas son relacionados con un URI viral o bronquitis. Se le tratar con antibiticos, medicamentos para la tos, aerosol nasal y [employer] account [employer]. Tambin puede tomar OTC Delsym para alivio adicional de la tos. Seguimiento con [employer] o [facility] si es necesario.

## 2018-12-30 NOTE — ED Provider Notes (Signed)
[facility] Emergency Department Provider Note ____________________________________________  Time seen: 1515  I have reviewed the triage vital signs and the nursing notes.  HISTORY  Chief Complaint  Generalized Body Aches; Fever; and Cough  HPI Jackson Li is a 28 y.o. male presents himself to the ED for evaluation of a 5-day complaint of cough and cold symptoms.  The patient describes body aches, sore throat, subjective fevers, and other flulike symptoms.  He did not receive the seasonal flu vaccine for this season.  He is also experiencing intermittent diarrhea.  He denies any nausea, vomiting, abdominal pain.  He has been taking DayQuil and NyQuil for his symptoms.  Presents now for further evaluation.  History reviewed. No pertinent past medical history.  There are no active problems to display for this patient.  History reviewed. No pertinent surgical history.  Prior to Admission medications   Medication Sig Start Date End Date Taking? Authorizing Provider  albuterol (PROVENTIL HFA;VENTOLIN HFA) 108 (90 Base) MCG/ACT inhaler Inhale 2 puffs into the lungs every 6 (six) hours as needed for wheezing or shortness of breath. 12/30/18   Verner Mccrone, Charlesetta Ivory, PA-C  azithromycin (ZITHROMAX Z-PAK) 250 MG tablet Take 2 tablets (500 mg) on  Day 1,  followed by 1 tablet (250 mg) once daily on Days 2 through 5. 12/30/18   Estefanny Moler, Charlesetta Ivory, PA-C  benzonatate (TESSALON PERLES) 100 MG capsule Take 1-2 tabs TID prn cough 12/30/18   Tamerra Merkley, Charlesetta Ivory, PA-C  fluticasone (FLONASE) 50 MCG/ACT nasal spray Place 2 sprays into both nostrils daily. 12/30/18   Neaveh Belanger, Charlesetta Ivory, PA-C    Allergies Shrimp [shellfish allergy]  No family history on file.  Social History Social History   Tobacco Use  . Smoking status: Current Every Day Smoker    Packs/day: 0.30    Types: Cigarettes  Substance Use Topics  . Alcohol use: No  . Drug use: No     Review of Systems  Constitutional: Positive for fever. Eyes: Negative for visual changes. ENT: Positive for sore throat. Cardiovascular: Negative for chest pain. Respiratory: Positive for shortness of breath. Gastrointestinal: Negative for abdominal pain, vomiting and diarrhea. Genitourinary: Negative for dysuria. Musculoskeletal: Negative for back pain. Skin: Negative for rash. Neurological: Negative for headaches, focal weakness or numbness. ____________________________________________  PHYSICAL EXAM:  VITAL SIGNS: ED Triage Vitals  Enc Vitals Group     BP 12/30/18 1447 (!) 149/84     Pulse Rate 12/30/18 1447 (!) 117     Resp 12/30/18 1447 14     Temp 12/30/18 1447 98.6 F (37 C)     Temp Source 12/30/18 1447 Oral     SpO2 12/30/18 1447 97 %     Weight 12/30/18 1448 220 lb (99.8 kg)     Height 12/30/18 1448 5\' 9"  (1.753 m)     Head Circumference --      Peak Flow --      Pain Score 12/30/18 1448 0     Pain Loc --      Pain Edu? --      Excl. in GC? --     Constitutional: Alert and oriented. Well appearing and in no distress. Head: Normocephalic and atraumatic. Eyes: Conjunctivae are normal. PERRL. Normal extraocular movements Ears: Canals clear. TMs intact bilaterally. Nose: No congestion/rhinorrhea/epistaxis. Mouth/Throat: Mucous membranes are moist.  Uvula is midline and tonsils are flat.  There is moderate oropharyngeal erythema noted. Neck: Supple. No thyromegaly. Hematological/Lymphatic/Immunological: No cervical lymphadenopathy.  Cardiovascular: Normal rate, regular rhythm. Normal distal pulses. Respiratory: Normal respiratory effort. No wheezes/rales/rhonchi. ____________________________________________   LABS (pertinent positives/negatives) Labs Reviewed  GROUP A STREP BY PCR  ____________________________________________  PROCEDURES  Procedures DuoNeb x1 ____________________________________________  INITIAL IMPRESSION / ASSESSMENT AND PLAN /  ED COURSE  Patient with ED evaluation of 5-day complaint of cough, congestion, sore throat, bodyaches.  Patient's clinical picture is concerning for viral etiology.  He is reassured by his negative strep test.  Clinical picture likely represents a viral URI including bronchitis.  Patient will be treated empirically with azithromycin, as well as prescriptions for albuterol, Tessalon Perles, and Flonase.  He is advised to follow-up with 1 of the local community clinics or return to the ED for acutely worsening symptoms. ____________________________________________  FINAL CLINICAL IMPRESSION(S) / ED DIAGNOSES  Final diagnoses:  Bronchitis  Viral syndrome      Karmen Stabs, Charlesetta Ivory, PA-C 12/30/18 1626    Sharman Cheek, MD 12/30/18 2302

## 2019-08-05 ENCOUNTER — Encounter: Payer: Self-pay | Admitting: Family Medicine

## 2019-08-12 ENCOUNTER — Encounter: Payer: Self-pay | Admitting: *Deleted

## 2019-10-02 ENCOUNTER — Encounter: Payer: Self-pay | Admitting: Emergency Medicine

## 2019-10-02 ENCOUNTER — Emergency Department
Admission: EM | Admit: 2019-10-02 | Discharge: 2019-10-03 | Disposition: A | Discharge status: Home / Self Care | Payer: Medicaid Other | Attending: Emergency Medicine | Admitting: Emergency Medicine

## 2019-10-02 ENCOUNTER — Other Ambulatory Visit: Payer: Self-pay

## 2019-10-02 DIAGNOSIS — Z79899 Other long term (current) drug therapy: Secondary | ICD-10-CM | POA: Diagnosis not present

## 2019-10-02 DIAGNOSIS — K011 Impacted teeth: Secondary | ICD-10-CM | POA: Diagnosis not present

## 2019-10-02 DIAGNOSIS — F17210 Nicotine dependence, cigarettes, uncomplicated: Secondary | ICD-10-CM | POA: Insufficient documentation

## 2019-10-02 DIAGNOSIS — K0889 Other specified disorders of teeth and supporting structures: Secondary | ICD-10-CM | POA: Diagnosis present

## 2019-10-02 MED ORDER — OXYCODONE-ACETAMINOPHEN 5-325 MG PO TABS
2.0000 | ORAL_TABLET | Freq: Once | ORAL | Status: AC
  Administered 2019-10-03: 2 via ORAL
  Filled 2019-10-02: qty 2

## 2019-10-02 MED ORDER — OXYCODONE-ACETAMINOPHEN 5-325 MG PO TABS: 2.0000 | ORAL_TABLET | Freq: Four times a day (QID) | ORAL | 0 refills | Status: DC | PRN

## 2019-10-02 NOTE — Discharge Instructions (Signed)
You have been seen in the Emergency Department (ED) today for dental pain.  Please take your prescribed antibiotic.  You may take pain medication as needed but ONLY as prescribed.  You should also take over-the-counter pain medication such as ibuprofen according to the label instructions unless a doctor has previously told you to avoid this type of medication (due to stomach ulcers, for example).  Alternatively you can take ibuprofen 600 mg by mouth three times daily with meals for no more than 5 days. ° °Take Percocet as prescribed for severe pain. Do not drink alcohol, drive or participate in any other potentially dangerous activities while taking this medication as it may make you sleepy. Do not take this medication with any other sedating medications, either prescription or over-the-counter. If you were prescribed Percocet or Vicodin, do not take these with acetaminophen (Tylenol) as it is already contained within these medications. °  °This medication is an opiate (or narcotic) pain medication and can be habit forming.  Use it as little as possible to achieve adequate pain control.  Do not use or use it with extreme caution if you have a history of opiate abuse or dependence.  If you are on a pain contract with your primary care doctor or a pain specialist, be sure to let them know you were prescribed this medication today from the [facility].  This medication is intended for your use only - do not give any to anyone else and keep it in a secure place where nobody else, especially children, have access to it.  It will also cause or worsen constipation, so you may want to consider taking an over-the-counter stool softener while you are taking this medication. ° °Please see you dentist as soon as possible; only a dentist will be able to fix your problem(s).  Please see below for dental follow up options. ° °Return to the ED if you develop worsening pain, fever, pus/drainage, difficulty  breathing, or other symptoms that concern you. °

## 2019-10-02 NOTE — ED Notes (Signed)
Patient reports having issues with his wisdom teeth and pain off/on for approximately 2 months.  Patient reports pain worse over past few days.  OTC were working but then today not working as well.

## 2019-10-02 NOTE — ED Triage Notes (Signed)
Patient with complaint of left side wisdom tooth pain times two weeks. Patient states that the pain has become worse.

## 2019-10-02 NOTE — ED Provider Notes (Signed)
[facility] Emergency Department Provider Note  ____________________________________________   First MD Initiated Contact with Patient 10/02/19 2309     (approximate)  I have reviewed the triage vital signs and the nursing notes.   HISTORY  Chief Complaint Dental Pain    HPI Jackson Li is a 28 y.o. male with no contributory significant past medical history who presents for evaluation of worsening pain in the left upper wisdom tooth.  He has been having pain there for 2 months but it got worse over the last day or 2.  He has been going to a dentist and has an appointment this coming week with an oral surgeon to talk about having the wisdom tooth removed, but the pain became severe tonight and radiating up into his face and head.  It is worse when he eats or drinks anything and nothing particular makes it better.  He has been taking naproxen but that has not been working as well recently.  He has completed 2 courses of what he believes is amoxicillin and he denies any significant swelling.  He does not have any pain radiating down his throat and has no difficulty swallowing or speaking.  He denies fever/chills, sore throat, cough, shortness of breath, nausea, vomiting, abdominal pain, and dysuria.         History reviewed. No pertinent past medical history.  There are no active problems to display for this patient.   Past Surgical History:  Procedure Laterality Date  . FINGER SURGERY      Prior to Admission medications   Medication Sig Start Date End Date Taking? Authorizing Provider  albuterol (PROVENTIL HFA;VENTOLIN HFA) 108 (90 Base) MCG/ACT inhaler Inhale 2 puffs into the lungs every 6 (six) hours as needed for wheezing or shortness of breath. 12/30/18   Menshew, Dannielle Karvonen, PA-C  azithromycin (ZITHROMAX Z-PAK) 250 MG tablet Take 2 tablets (500 mg) on  Day 1,  followed by 1 tablet (250 mg) once daily on Days 2 through 5. 12/30/18    Menshew, Dannielle Karvonen, PA-C  benzonatate (TESSALON PERLES) 100 MG capsule Take 1-2 tabs TID prn cough 12/30/18   Menshew, Dannielle Karvonen, PA-C  fluticasone (FLONASE) 50 MCG/ACT nasal spray Place 2 sprays into both nostrils daily. 12/30/18   Menshew, Dannielle Karvonen, PA-C  oxyCODONE-acetaminophen (PERCOCET) 5-325 MG tablet Take 2 tablets by mouth every 6 (six) hours as needed for severe pain. 10/02/19   Hinda Kehr, MD    Allergies Shrimp [shellfish allergy]  No family history on file.  Social History Social History   Tobacco Use  . Smoking status: Current Every Day Smoker    Packs/day: 0.30    Types: Cigarettes  . Smokeless tobacco: Never Used  Substance Use Topics  . Alcohol use: No  . Drug use: Yes    Types: Marijuana    Review of Systems Constitutional: No fever/chills Eyes: No visual changes. ENT: Dental pain as described above.  No sore throat. Respiratory: Denies shortness of breath. Gastrointestinal: No abdominal pain.  No nausea, no vomiting.   Musculoskeletal: Negative for neck pain.  Negative for back pain. Integumentary: Negative for rash. Neurological: Generalized headache radiating from the left upper rear part of his mouth.   ____________________________________________   PHYSICAL EXAM:  VITAL SIGNS: ED Triage Vitals  Enc Vitals Group     BP 10/02/19 2154 122/77     Pulse Rate 10/02/19 2154 (!) 105     Resp 10/02/19 2154 18  Temp 10/02/19 2154 99 F (37.2 C)     Temp src --      SpO2 10/02/19 2154 100 %     Weight 10/02/19 2155 104.3 kg (230 lb)     Height 10/02/19 2155 1.753 m (5\' 9" )     Head Circumference --      Peak Flow --      Pain Score 10/02/19 2155 10     Pain Loc --      Pain Edu? --      Excl. in GC? --     Constitutional: Alert and oriented.  Appears uncomfortable but not in severe distress Eyes: Conjunctivae are normal.  Head: Atraumatic. Nose: No congestion/rhinnorhea. Mouth/Throat: Patient is wearing a mask.  Upon  asking the patient to remove the mask, his oropharyngeal exam is reassuring.  He has no induration underneath the tongue, no obvious swelling anywhere in the oropharynx including no evidence of any peritonsillar abscess.  He has an impacted left upper wisdom tooth that does not appear acutely infected but is very tender to palpation. Neck: No stridor.  No meningeal signs.   Cardiovascular: Normal rate, regular rhythm.  Respiratory: Normal respiratory effort.  No retractions. Neurologic:  Normal speech and language. No gross focal neurologic deficits are appreciated.  Skin:  Skin is warm, dry and intact. Psychiatric: Mood and affect are normal. Speech and behavior are normal.  ____________________________________________   LABS (all labs ordered are listed, but only abnormal results are displayed)  Labs Reviewed - No data to display ____________________________________________  EKG  None - EKG not ordered by ED physician ____________________________________________  RADIOLOGY I, Loleta Roseory Rakesha Dalporto, personally viewed and evaluated these images (plain radiographs) as part of my medical decision making, as well as reviewing the written report by the radiologist.  ED MD interpretation:  No indication for emergent imaging  Official radiology report(s): No results found.  ____________________________________________   PROCEDURES   Procedure(s) performed (including Critical Care):  Procedures   ____________________________________________   INITIAL IMPRESSION / MDM / ASSESSMENT AND PLAN / ED COURSE  As part of my medical decision making, I reviewed the following data within the electronic MEDICAL RECORD NUMBER Nursing notes reviewed and incorporated, Old chart reviewed, Notes from prior ED visits and [place] Controlled Substance Database   Differential diagnosis includes, but is not limited to, impacted wisdom tooth, periapical abscess, Ludwick's angina, peritonsillar abscess, retropharyngeal  abscess, bacterial or viral pharyngitis, parotitis.  Patient is well-appearing and appears uncomfortable but is not in severe distress.  He is afebrile.  He has no other symptoms except for localized left upper wisdom tooth pain that is radiating up into his head.  He has been trying to get outpatient follow-up and has just completed 2 courses of antibiotics.  There is no sign that he would benefit from additional antibiotics at this point and he has an upcoming appointment with an oral surgeon to have the tooth removed.  I explained to him that there is not much else I can do tonight except with pain control.  He does not show up in the [place] controlled substance database and does not have frequent visits to the emergency department.  I will give him 2 Percocet tonight and a prescription for a few Percocet as an outpatient but I stressed him the importance of following up with the dental.  He understands agrees with plan.  ____________________________________________  FINAL CLINICAL IMPRESSION(S) / ED DIAGNOSES  Final diagnoses:  Impacted third molar tooth  Pain,  dental     MEDICATIONS GIVEN DURING THIS VISIT:  Medications  oxyCODONE-acetaminophen (PERCOCET/ROXICET) 5-325 MG per tablet 2 tablet (has no administration in time range)     ED Discharge Orders         Ordered    oxyCODONE-acetaminophen (PERCOCET) 5-325 MG tablet  Every 6 hours PRN     10/02/19 2351          *Please note:  Jackson Li was evaluated in Emergency Department on 10/02/2019 for the symptoms described in the history of present illness. He was evaluated in the context of the global COVID-19 pandemic, which necessitated consideration that the patient might be at risk for infection with the SARS-CoV-2 virus that causes COVID-19. Institutional protocols and algorithms that pertain to the evaluation of patients at risk for COVID-19 are in a state of rapid change based on information released by  regulatory bodies including the CDC and federal and state organizations. These policies and algorithms were followed during the patient's care in the ED.  Some ED evaluations and interventions may be delayed as a result of limited staffing during the pandemic.*  Note:  This document was prepared using Dragon voice recognition software and may include unintentional dictation errors.   Loleta Rose, MD 10/02/19 2352

## 2019-10-03 ENCOUNTER — Telehealth: Payer: Self-pay | Admitting: Emergency Medicine

## 2019-10-03 MED ORDER — OXYCODONE-ACETAMINOPHEN 5-325 MG PO TABS
2.0000 | ORAL_TABLET | Freq: Four times a day (QID) | ORAL | 0 refills | Status: AC | PRN
Start: 2019-10-03 — End: 2019-10-06

## 2019-10-03 NOTE — Telephone Encounter (Signed)
Pharmacy called, need to limit rx to max of 6 tabs daily. For PRN dosing, this is reasonable, will resubmit new rx with same directions as Dr. Karma Greaser but with added limit of 6 tabs a day. Old rx being discarded by pharmacy.

## 2020-11-30 ENCOUNTER — Other Ambulatory Visit: Payer: Self-pay

## 2020-11-30 ENCOUNTER — Emergency Department
Admission: EM | Admit: 2020-11-30 | Discharge: 2020-11-30 | Disposition: A | Discharge status: Home / Self Care | Payer: Medicaid Other | Attending: Student in an Organized Health Care Education/Training Program | Admitting: Student in an Organized Health Care Education/Training Program

## 2020-11-30 ENCOUNTER — Emergency Department: Payer: Medicaid Other

## 2020-11-30 DIAGNOSIS — S62109A Fracture of unspecified carpal bone, unspecified wrist, initial encounter for closed fracture: Secondary | ICD-10-CM

## 2020-11-30 DIAGNOSIS — F17210 Nicotine dependence, cigarettes, uncomplicated: Secondary | ICD-10-CM | POA: Diagnosis not present

## 2020-11-30 DIAGNOSIS — S6292XA Unspecified fracture of left wrist and hand, initial encounter for closed fracture: Secondary | ICD-10-CM | POA: Diagnosis not present

## 2020-11-30 DIAGNOSIS — S6992XA Unspecified injury of left wrist, hand and finger(s), initial encounter: Secondary | ICD-10-CM | POA: Diagnosis present

## 2020-11-30 DIAGNOSIS — Y9389 Activity, other specified: Secondary | ICD-10-CM | POA: Insufficient documentation

## 2020-11-30 DIAGNOSIS — X500XXA Overexertion from strenuous movement or load, initial encounter: Secondary | ICD-10-CM | POA: Insufficient documentation

## 2020-11-30 MED ORDER — KETOROLAC TROMETHAMINE 30 MG/ML IJ SOLN
30.0000 mg | Freq: Once | INTRAMUSCULAR | Status: AC
  Administered 2020-11-30: 30 mg via INTRAMUSCULAR
  Filled 2020-11-30: qty 1

## 2020-11-30 MED ORDER — KETOROLAC TROMETHAMINE 10 MG PO TABS: 10.0000 mg | ORAL_TABLET | Freq: Four times a day (QID) | ORAL | 0 refills | Status: AC | PRN

## 2020-11-30 NOTE — ED Notes (Signed)
Pt requesting pain meds. Provider Loreta [address] notified. Pt sitting calmly in bed. Resp reg/unlabored. Skin dry.

## 2020-11-30 NOTE — ED Notes (Signed)
Pt signed printed d/c paperwork.  

## 2020-11-30 NOTE — Discharge Instructions (Addendum)
Your x-ray shows that you might have an avulsion fracture of your wrist.  Please wear the wrist splint.  Please call orthopedics on Monday for a follow-up appointment next week.

## 2020-11-30 NOTE — ED Provider Notes (Signed)
[facility] Emergency Department Provider Note  ____________________________________________  Time seen: Approximately 4:00 PM  I have reviewed the triage vital signs and the nursing notes.   HISTORY  Chief Complaint Wrist Pain    HPI Jackson Li is a 29 y.o. male that presents to the emergency department for evaluation of left wrist pain for about 1 month.  Patient states that he was opening a jar and took several tries and tried to open very hard when he had pain to his left wrist.  He has had intermittent pain to his left wrist since then.  He does wear a small wrist splint.  He has taken Advil but the pain always returns.  No additional trauma.  No past medical history on file.  There are no problems to display for this patient.   Past Surgical History:  Procedure Laterality Date  . FINGER SURGERY      Prior to Admission medications   Medication Sig Start Date End Date Taking? Authorizing Provider  ketorolac (TORADOL) 10 MG tablet Take 1 tablet (10 mg total) by mouth every 6 (six) hours as needed. 11/30/20  Yes Enid Derry, PA-C  albuterol (PROVENTIL HFA;VENTOLIN HFA) 108 (90 Base) MCG/ACT inhaler Inhale 2 puffs into the lungs every 6 (six) hours as needed for wheezing or shortness of breath. 12/30/18   Menshew, Charlesetta Ivory, PA-C  azithromycin (ZITHROMAX Z-PAK) 250 MG tablet Take 2 tablets (500 mg) on  Day 1,  followed by 1 tablet (250 mg) once daily on Days 2 through 5. 12/30/18   Menshew, Charlesetta Ivory, PA-C  benzonatate (TESSALON PERLES) 100 MG capsule Take 1-2 tabs TID prn cough 12/30/18   Menshew, Charlesetta Ivory, PA-C  fluticasone (FLONASE) 50 MCG/ACT nasal spray Place 2 sprays into both nostrils daily. 12/30/18   Menshew, Charlesetta Ivory, PA-C    Allergies Shrimp [shellfish allergy]  No family history on file.  Social History Social History   Tobacco Use  . Smoking status: Current Every Day Smoker    Packs/day: 0.30     Types: Cigarettes  . Smokeless tobacco: Never Used  Substance Use Topics  . Alcohol use: No  . Drug use: Yes    Types: Marijuana     Review of Systems  Cardiovascular: No chest pain. Respiratory: No SOB. Gastrointestinal: No abdominal pain.  No nausea, no vomiting.  Musculoskeletal: Positive for wrist pain. Skin: Negative for rash, abrasions, lacerations, ecchymosis. Neurological: Negative for headaches, numbness or tingling   ____________________________________________   PHYSICAL EXAM:  VITAL SIGNS: ED Triage Vitals  Enc Vitals Group     BP 11/30/20 1416 133/85     Pulse Rate 11/30/20 1416 99     Resp 11/30/20 1416 20     Temp 11/30/20 1416 98.4 F (36.9 C)     Temp Source 11/30/20 1416 Oral     SpO2 11/30/20 1416 100 %     Weight 11/30/20 1417 250 lb (113.4 kg)     Height 11/30/20 1417 5\' 8"  (1.727 m)     Head Circumference --      Peak Flow --      Pain Score 11/30/20 1419 8     Pain Loc --      Pain Edu? --      Excl. in GC? --      Constitutional: Alert and oriented. Well appearing and in no acute distress. Eyes: Conjunctivae are normal. PERRL. EOMI. Head: Atraumatic. ENT:  Ears:      Nose: No congestion/rhinnorhea.      Mouth/Throat: Mucous membranes are moist.  Neck: No stridor.  Cardiovascular: Normal rate, regular rhythm.  Good peripheral circulation.  Symmetric radial pulses bilaterally. Respiratory: Normal respiratory effort without tachypnea or retractions. Lungs CTAB. Good air entry to the bases with no decreased or absent breath sounds. Gastrointestinal: Bowel sounds 4 quadrants. Soft and nontender to palpation. No guarding or rigidity. No palpable masses. No distention.  Musculoskeletal: Full range of motion to all extremities. No gross deformities appreciated.  Pain elicited with range of motion of left wrist.  Tenderness to palpation over dorsal left wrist. Neurologic:  Normal speech and language. No gross focal neurologic deficits are  appreciated.  Skin:  Skin is warm, dry and intact. No rash noted. Psychiatric: Mood and affect are normal. Speech and behavior are normal. Patient exhibits appropriate insight and judgement.   ____________________________________________   LABS (all labs ordered are listed, but only abnormal results are displayed)  Labs Reviewed - No data to display ____________________________________________  EKG   ____________________________________________  RADIOLOGY Lexine Baton, personally viewed and evaluated these images (plain radiographs) as part of my medical decision making, as well as reviewing the written report by the radiologist.  DG Wrist Complete Left  Result Date: 11/30/2020 CLINICAL DATA:  Left wrist pain for 1 month after trying to open a jar. Limited range of motion. EXAM: LEFT WRIST - COMPLETE 3+ VIEW COMPARISON:  04/09/2015. FINDINGS: On the lateral view only, there is a small bony fragment that projects beyond the volar margin of the scaphoid adjacent to the volar distal aspect the lunate. This suggests a small avulsion fracture but is of unclear origin. There is no other evidence of a fracture. The wrist joints are normally spaced and aligned. No arthropathic changes. Soft tissues are unremarkable. IMPRESSION: 1. Possible small avulsion fracture, that is visualized on the lateral view only, along the volar/anterior margin of the carpus. This could be further assessed with high-resolution unenhanced CT. No other abnormality. Electronically Signed   By: Amie Portland M.D.   On: 11/30/2020 15:18    ____________________________________________    PROCEDURES  Procedure(s) performed:    Procedures    Medications  ketorolac (TORADOL) 30 MG/ML injection 30 mg (30 mg Intramuscular Given 11/30/20 1630)     ____________________________________________   INITIAL IMPRESSION / ASSESSMENT AND PLAN / ED COURSE  Pertinent labs & imaging results that were available  during my care of the patient were reviewed by me and considered in my medical decision making (see chart for details).  Review of the [place] CSRS was performed in accordance of the [facility] prior to dispensing any controlled drugs.   Patient's diagnosis is consistent with possible avulsion fracture.  Vital signs and exam are reassuring.  X-ray consistent with a possible small avulsion fracture.  Velcro wrist splint was placed. Patient is to follow up with orthopedics next week. Patient is given ED precautions to return to the ED for any worsening or new symptoms.  Jackson Li was evaluated in Emergency Department on 11/30/2020 for the symptoms described in the history of present illness. He was evaluated in the context of the global COVID-19 pandemic, which necessitated consideration that the patient might be at risk for infection with the SARS-CoV-2 virus that causes COVID-19. Institutional protocols and algorithms that pertain to the evaluation of patients at risk for COVID-19 are in a state of rapid change based on information released by regulatory bodies including  the [employer] and federal and state organizations. These policies and algorithms were followed during the patient's care in the ED.  ____________________________________________  FINAL CLINICAL IMPRESSION(S) / ED DIAGNOSES  Final diagnoses:  Avulsion fracture of wrist      NEW MEDICATIONS STARTED DURING THIS VISIT:  ED Discharge Orders         Ordered    ketorolac (TORADOL) 10 MG tablet  Every 6 hours PRN        11/30/20 1628              This chart was dictated using voice recognition software/Dragon. Despite best efforts to proofread, errors can occur which can change the meaning. Any change was purely unintentional.    Enid Derry, PA-C 11/30/20 1812    Willy Eddy, MD 12/02/20 [id number]

## 2020-11-30 NOTE — ED Triage Notes (Signed)
Pt presents via POV c/o left wrist pain x1 month after trying to open jar.
